# Patient Record
Sex: Male | Born: 1970 | Race: White | Hispanic: No | Marital: Married
Health system: Southern US, Community
[De-identification: ages and names within clinical notes are randomized; demographics above are authoritative.]

---

## 1998-05-28 ENCOUNTER — Ambulatory Visit (HOSPITAL_COMMUNITY): Admission: EM | Admit: 1998-05-28 | Discharge: 1998-05-28 | Payer: Self-pay | Admitting: Emergency Medicine

## 1998-07-13 ENCOUNTER — Ambulatory Visit (HOSPITAL_COMMUNITY): Admission: RE | Admit: 1998-07-13 | Discharge: 1998-07-13 | Payer: Self-pay | Admitting: Gastroenterology

## 1999-02-10 ENCOUNTER — Emergency Department (HOSPITAL_COMMUNITY): Admission: EM | Admit: 1999-02-10 | Discharge: 1999-02-10 | Payer: Self-pay | Admitting: Emergency Medicine

## 1999-12-13 ENCOUNTER — Encounter: Admission: RE | Admit: 1999-12-13 | Discharge: 1999-12-20 | Payer: Self-pay | Admitting: Anesthesiology

## 1999-12-26 ENCOUNTER — Encounter: Admission: RE | Admit: 1999-12-26 | Discharge: 2000-03-25 | Payer: Self-pay | Admitting: Anesthesiology

## 2000-06-11 ENCOUNTER — Encounter: Admission: RE | Admit: 2000-06-11 | Discharge: 2000-06-11 | Payer: Self-pay

## 2001-12-01 ENCOUNTER — Encounter: Payer: Self-pay | Admitting: Emergency Medicine

## 2001-12-01 ENCOUNTER — Emergency Department (HOSPITAL_COMMUNITY): Admission: EM | Admit: 2001-12-01 | Discharge: 2001-12-01 | Payer: Self-pay | Admitting: Emergency Medicine

## 2002-04-20 ENCOUNTER — Encounter: Payer: Self-pay | Admitting: Family Medicine

## 2002-04-20 ENCOUNTER — Encounter: Admission: RE | Admit: 2002-04-20 | Discharge: 2002-04-20 | Payer: Self-pay | Admitting: Family Medicine

## 2002-04-23 ENCOUNTER — Emergency Department (HOSPITAL_COMMUNITY): Admission: EM | Admit: 2002-04-23 | Discharge: 2002-04-23 | Payer: Self-pay

## 2002-04-27 ENCOUNTER — Encounter: Payer: Self-pay | Admitting: Neurosurgery

## 2002-04-28 ENCOUNTER — Encounter: Payer: Self-pay | Admitting: Neurosurgery

## 2002-04-29 ENCOUNTER — Inpatient Hospital Stay (HOSPITAL_COMMUNITY): Admission: RE | Admit: 2002-04-29 | Discharge: 2002-04-30 | Payer: Self-pay | Admitting: Neurosurgery

## 2005-06-08 ENCOUNTER — Observation Stay (HOSPITAL_COMMUNITY): Admission: EM | Admit: 2005-06-08 | Discharge: 2005-06-09 | Payer: Self-pay | Admitting: Emergency Medicine

## 2008-04-17 IMAGING — CT CT HEAD W/O CM
2 series · 16 of 30 positions shown, 20 images · non-contrast
Comparison: NONE

CLINICAL DATA: Headaches. 

CT HEAD WITHOUT INTRAVENOUS CONTRAST
TECHNIQUE: Axial 5-millimeter thick slices were obtained through 
the posterior fossa and 5-millimeter thick slices were obtained 
through the remaining portion of the head without intravenous 
contrast.

[Series 2: without contrast · axial · non-contrast · 0.45mm/px · z∈[+84,+234]mm · 13 of 36 slices shown, 17 images]
[im 3/36  brain]
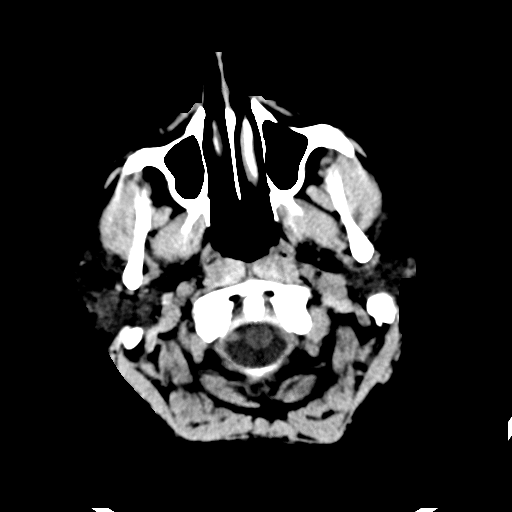
[im 3/36  bone]
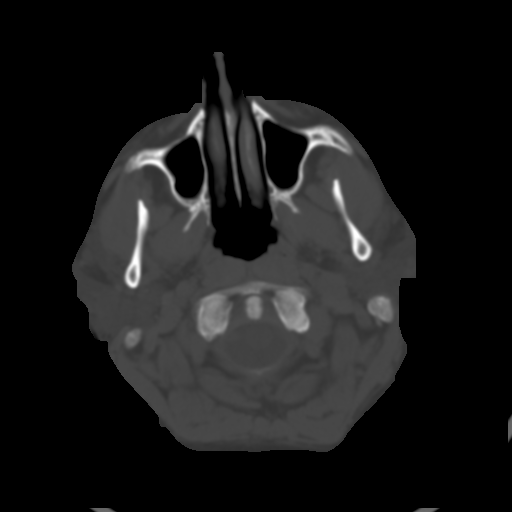
[im 6/36  brain]
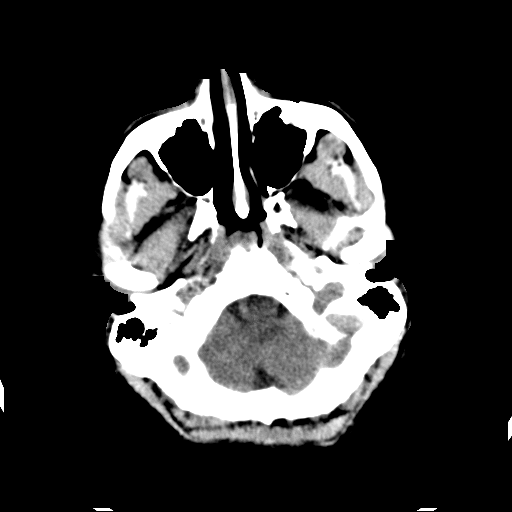
[im 8/36  brain]
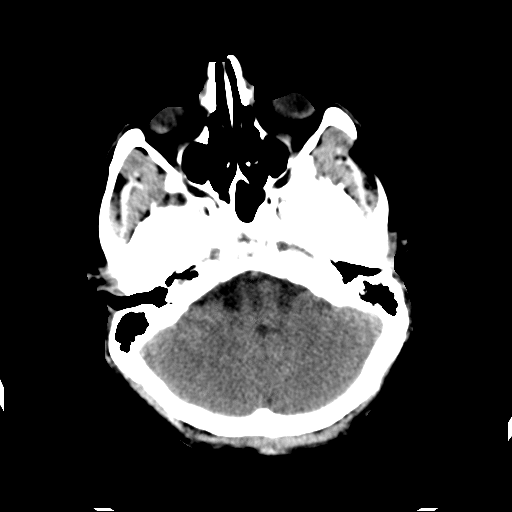
[im 11/36  brain]
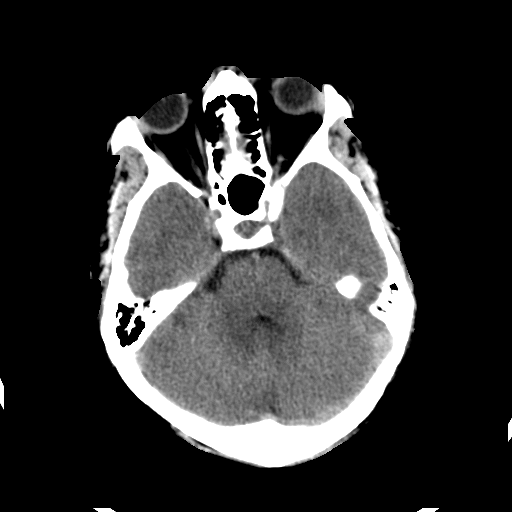
[im 13/36  brain]
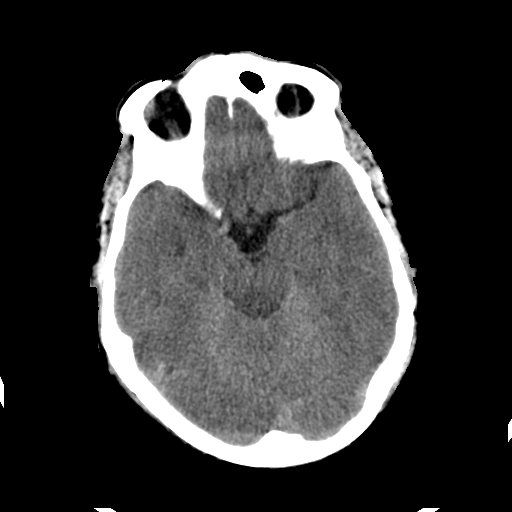
[im 13/36  bone]
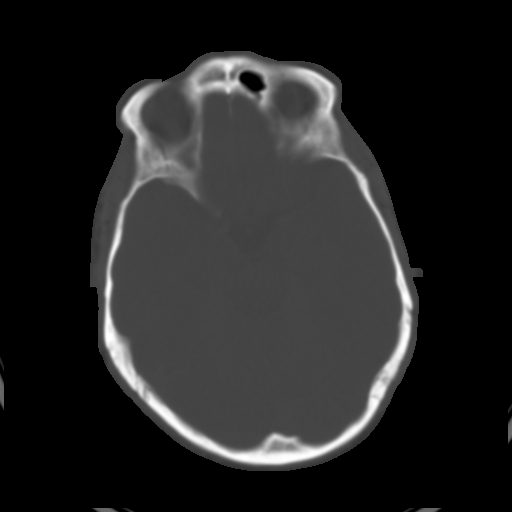
[im 16/36  brain]
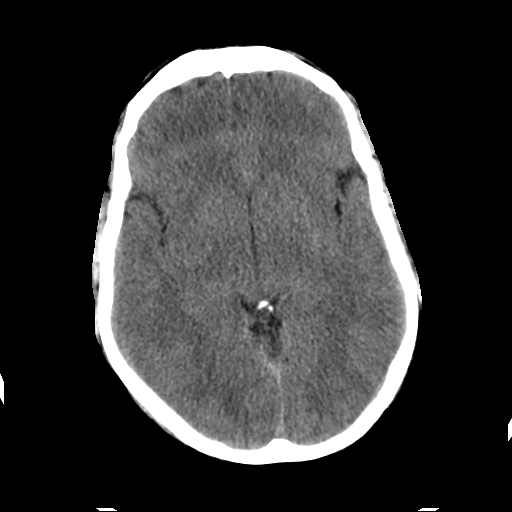
[im 18/36  brain]
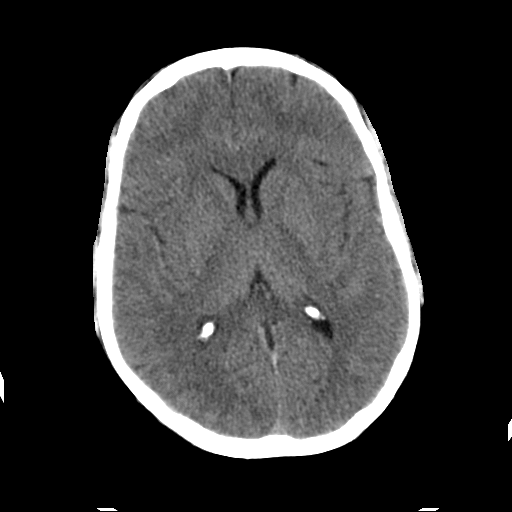
[im 21/36  brain]
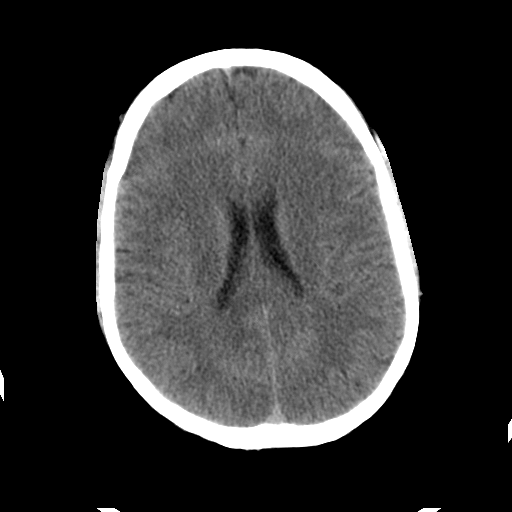
[im 23/36  brain]
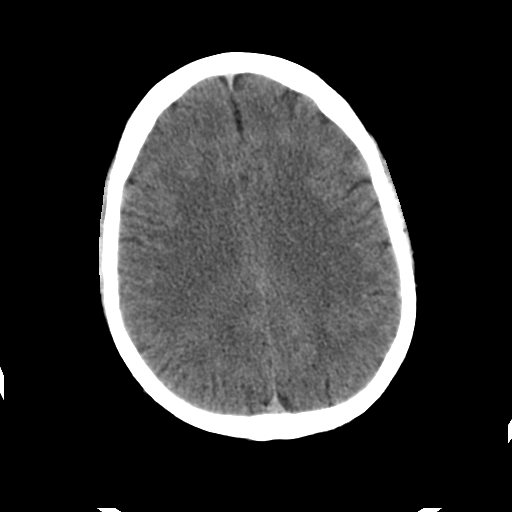
[im 23/36  bone]
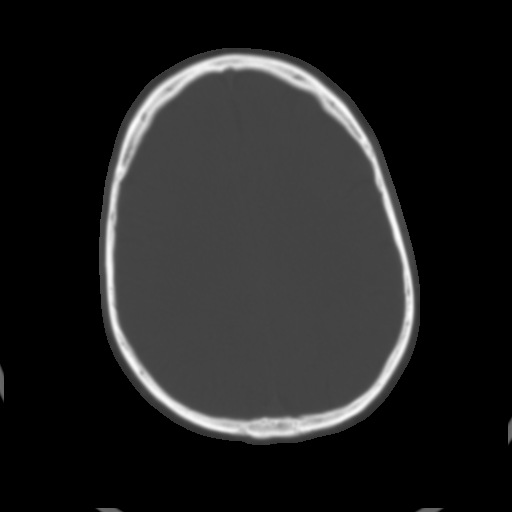
[im 26/36  brain]
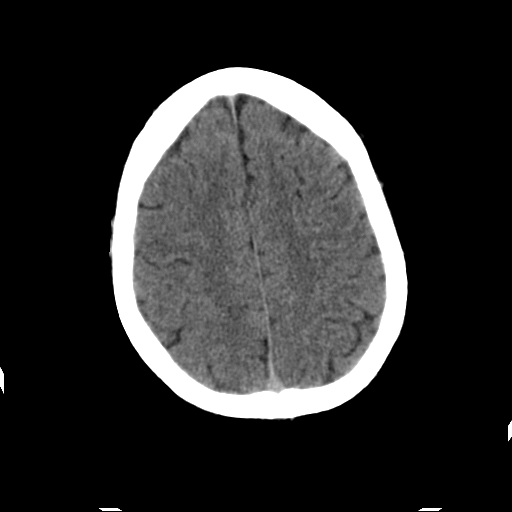
[im 28/36  brain]
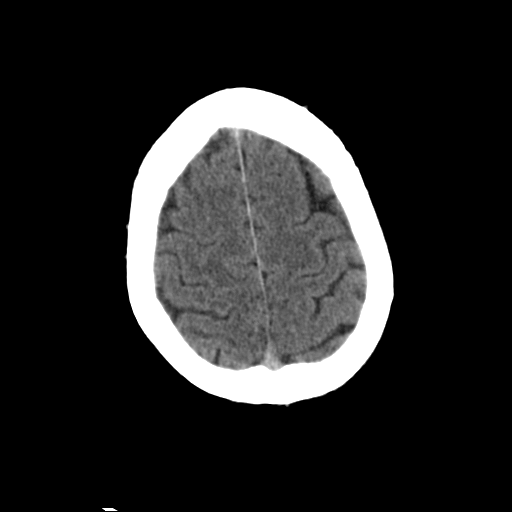
[im 31/36  brain]
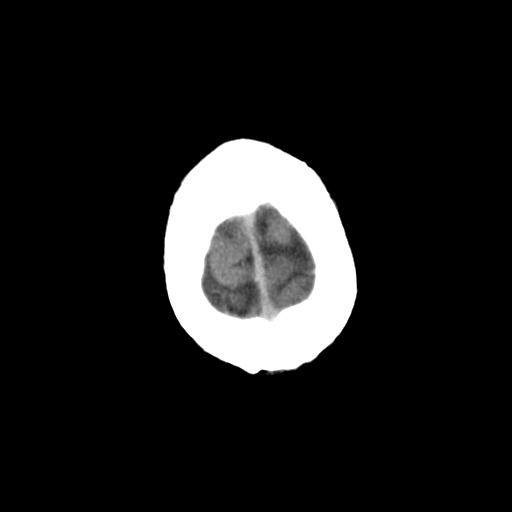
[im 33/36  brain]
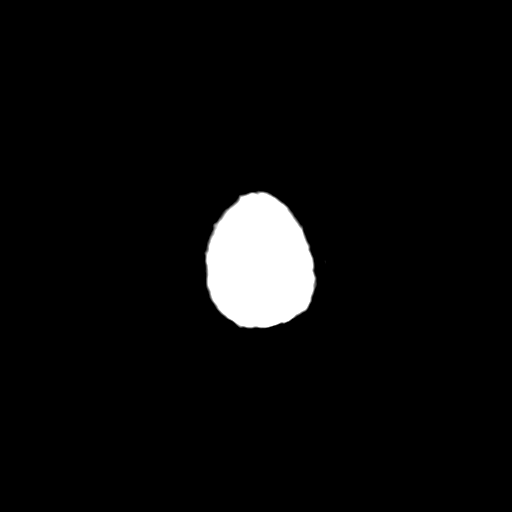
[im 33/36  bone]
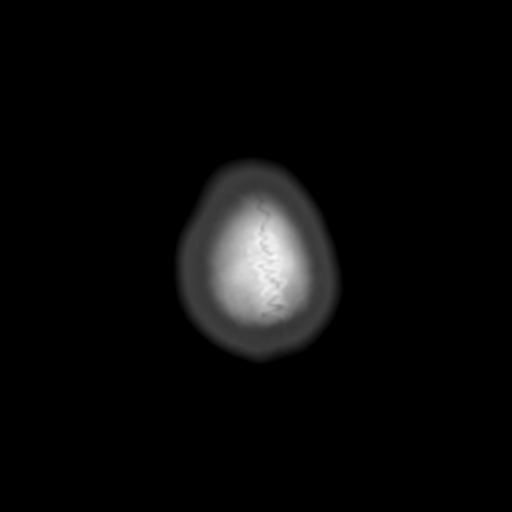

[Series 3: bone windows · axial · 0.45mm/px · z∈[+84,+134]mm · 3 of 36 slices shown]
[im 3/36  bone]
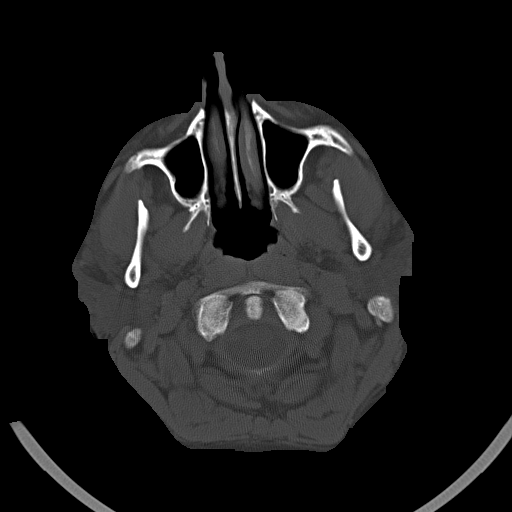
[im 8/36  bone]
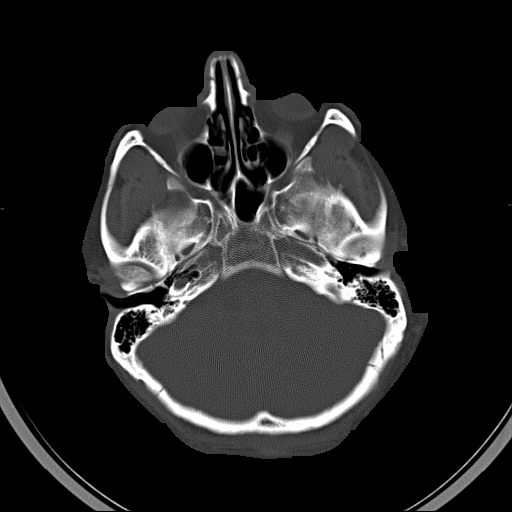
[im 13/36  bone]
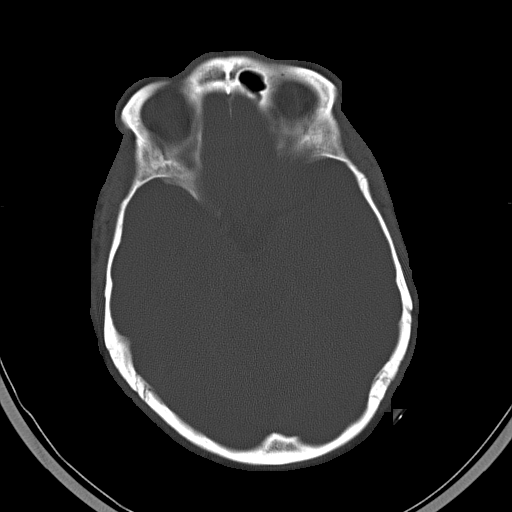

[16 of 30 positions shown; findings below may reference images not displayed]

FINDINGS: There are no air-fluid levels or opacification of the 
paranasal sinuses.  No fluid noted in the mastoid processes. The 
fourth, third and both lateral ventricles are identified.  There 
is no evidence of midline shift or mass effect infratentorially or 
supratentorially. No areas of abnormal radiolucency or 
radiodensity are identified. There is no evidence of subarachnoid 
or intracerebral hemorrhage.  There is no evidence of subdural or 
epidural hematoma. The calvarium is intact.
IMPRESSION: Normal unenhanced CT of the head. Gentilal, Gertrudes Date: 02/22/2007 DAS  JLM

## 2018-07-26 ENCOUNTER — Encounter: Payer: Self-pay | Admitting: Sports Medicine

## 2018-07-26 ENCOUNTER — Ambulatory Visit (INDEPENDENT_AMBULATORY_CARE_PROVIDER_SITE_OTHER): Payer: Self-pay | Admitting: Sports Medicine

## 2018-07-26 ENCOUNTER — Ambulatory Visit
Admission: RE | Admit: 2018-07-26 | Discharge: 2018-07-26 | Disposition: A | Discharge status: Home / Self Care | Payer: No Typology Code available for payment source | Source: Ambulatory Visit | Attending: Sports Medicine | Admitting: Sports Medicine

## 2018-07-26 VITALS — BP 120/90 | Ht 72.0 in | Wt 230.0 lb

## 2018-07-26 DIAGNOSIS — M25512 Pain in left shoulder: Principal | ICD-10-CM

## 2018-07-26 DIAGNOSIS — G8929 Other chronic pain: Secondary | ICD-10-CM

## 2018-07-26 NOTE — Progress Notes (Addendum)
   Subjective:    Patient ID: Rick Frey, male    DOB: Sep 24, 1971, 47 y.o.   MRN: [id number]  HPIchief complaint: Left shoulder pain  Very pleasant right-hand-dominant 47 year old male comes in today complaining of a little over a year of anterior and superior left shoulder pain. Pain began acutely when pulling some straw from a truck. He felt immediate pain which did improve over time. He has learned to modify his activity in regards to work. Any sort of pushing or pulling at shoulder level or higher will reproduce his pain. He had significant nighttime pain initially but he denies nighttime pain currently. He denies numbness or tingling. His primary care physician put him on oral prednisone which helped tremendously but eventually wore off. He also tried Aleve and Advil but began to experience significant stomach upset with these. He is currently using over-the-counter topical medications. He denies significant injury to this shoulder prior to the injury a little over a year ago.No prior shoulder surgeries. Past medical history is reviewed Medications reviewed Allergies reviewed He is a self-employed landscaper    Review of Systems As above    Objective:   Physical Exam  Well-developed, well-nourished. No acute distress. Awake alert and oriented 3. Vital signs reviewed  Left shoulder: Full range of motion. No gross deformity. Tenderness to palpation directly over the acromioclavicular joint. Pain with cross arm abduction test. No tenderness over the bicipital groove. Rotator cuff strength is 5/5 and does not reproduce pain. Negative empty can, negative Hawkins. Equivocal O'Brien's. Neurovascularly intact distally.      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 We may also plan on doing an ultrasound guided injection of his acromioclavicular joint at that time.  I will order an x-ray of his shoulder to be done prior to that visit. We will discuss those results at follow-up. I think he is okay to continue with activity as tolerated.  Addendum:X-ray reviewed. There are mild degenerative changes at the acromioclavicular joint. Otherwise unremarkable.

## 2018-08-04 ENCOUNTER — Encounter: Payer: Self-pay | Admitting: Sports Medicine

## 2018-08-04 ENCOUNTER — Ambulatory Visit (INDEPENDENT_AMBULATORY_CARE_PROVIDER_SITE_OTHER): Payer: Self-pay | Admitting: Sports Medicine

## 2018-08-04 DIAGNOSIS — M19012 Primary osteoarthritis, left shoulder: Secondary | ICD-10-CM | POA: Insufficient documentation

## 2018-08-04 MED ORDER — METHYLPREDNISOLONE ACETATE 40 MG/ML IJ SUSP
40.0000 mg | Freq: Once | INTRAMUSCULAR | Status: AC
  Administered 2018-08-04: 40 mg via INTRA_ARTICULAR

## 2018-08-04 NOTE — Assessment & Plan Note (Addendum)
Reviewed his x-rays with the patient and his wife demonstrating mild to moderate degenerative changes in [facility].  No abnormalities at the glenohumeral joint.  Recommend a corticosteroid injection.  INJECTION: Patient was given informed consent, signed copy in the chart. Appropriate time out was taken. Area prepped and draped in usual sterile fashion. 1 cc of methylprednisolone 40 mg/ml plus  1 cc of 0.25% bupivicaine without epinephrine was injected into the L AC joint using ultrasound guidance. The patient tolerated the procedure well. There were no complications. Post procedure instructions were given.  Recommend ice and no heavy lifting for the next two days. Afterwards, activity as tolerated with attention to proper lifting technique. NSAIDs as needed for pain.

## 2018-08-04 NOTE — Patient Instructions (Signed)
F/u with Margaretha Sheffieldraper in 3 weeks.  Injection of L AC joint today under US guidance. Counseled on post injection instructions and possibility of steroid flare. Take it easy the next two days until the steroid kicks in. Call if you have any issues.  Rotator cuff was intact via ultrasound today.

## 2018-08-04 NOTE — Progress Notes (Signed)
Rick Frey - 47 y.o. male MRN [id number]  Date of birth: 02-19-1971   Chief complaint: L shoulder pain  SUBJECTIVE:    History of present illness: 47yo M who presents today with the chief complaint of left superior shoulder pain. Symptoms began 1.5 years ago when he was grabbing a bale of straw away from his body. He felt a sudden pain in his shoulder. Denies a pop or sudden onset weakness. Since then, he has modified his activities to help with pain control.  He also takes ibuprofen intermittently for the pain although this does cause gastrointestinal symptoms.  Any time that he does repetitive lifting or overdoes it, his symptoms are exacerbated.  Relative rest improves his symptoms like when they were on vacation 3 weeks ago.  Denies associated numbness or tingling of the arm.  Denies any loss of grip strength or loss of range of motion of the left arm.  No neck pain or elbow pain.  He is right-handed.   Review of systems:  As stated above   Interval past medical history, surgical history, family history, and social history obtained and are unchanged.   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  Medications: Ibuprofen Allergies: No known drug allergies  OBJECTIVE:  Physical exam: Vital signs are reviewed. BP 120/86   Ht 6' (1.829 m)   Wt 230 lb (104.3 kg)   BMI 31.19 kg/m   Gen.: Alert, oriented, appears stated age, in no apparent distress Musculoskeletal: No obvious abnormality upon inspection of the left shoulder.  He has tenderness palpation directly over the acromioclavicular joint.  He also has some bicipital groove tenderness.  He does have pain with adduction of his left arm.  He has full range of motion in shoulder flexion, extension, abduction and adduction.  Strength testing is 5 out of 5 of the rotator cuff without reproducible pain.  He has a negative Neer's test.  Negative empty can.  Negative Hawkins.  Pain with O'Brien's test.  Pain with crossarm test.  Negative  speeds and Yergason's test.  Neurovascularly intact.  ULTRASOUND: Shoulder, L Diagnostic complete ultrasound imaging obtained of patient's L shoulder.  - No obvious evidence of bony deformity or osteophyte development appreciated.  - Long head of the biceps tendon: trace edema of biceps tendon in bicipital groove.  - Subscapularis tendon: complete visualization across the width of the insertion point yielded no evidence of tendon thickening, calcification, or tears in the long axis view.  - Supraspinatus tendon: complete visualization across the width of the insertion point yielded no evidence of tendon thickening, calcification, or tears in the long axis view. No evidence of bursal inflammation appreciated.  - Infraspinatus and teres minor tendons: visualization across the width of the insertion points yielded no evidence of tendon thickening, calcification, or tears in the long axis view.  [facility] Joint: Trace effusion present with mild spurring.  IMPRESSION: findings consistent with bicipital tendinitis and AC joint arthritis.    ASSESSMENT & PLAN: Arthritis of left acromioclavicular joint Reviewed his x-rays with the patient and his wife demonstrating mild to moderate degenerative changes in his [facility] joint.  No abnormalities at the glenohumeral joint.  Recommend a corticosteroid injection.  INJECTION: Patient was given informed consent, signed copy in the chart. Appropriate time out was taken. Area prepped and draped in usual sterile fashion. 1 cc of methylprednisolone 40 mg/ml plus  1 cc of 0.25% bupivicaine without epinephrine was injected into the L AC joint using ultrasound guidance. The  patient tolerated the procedure well. There were no complications. Post procedure instructions were given.  Recommend ice and no heavy lifting for the next two days. Afterwards, activity as tolerated with attention to proper lifting technique. NSAIDs as needed for pain.    Meds ordered this encounter    Medications  . methylPREDNISolone acetate (DEPO-MEDROL) injection 40 mg      Gustavus MessingAJ Tyshan Enderle, DO Sports Medicine Fellow [facility]

## 2018-08-24 ENCOUNTER — Other Ambulatory Visit: Payer: Self-pay | Admitting: Sports Medicine

## 2019-09-22 IMAGING — DX DG SHOULDER 2+V*L*
3 series · 3 of 3 positions shown · non-contrast
Comparison: None.

CLINICAL DATA: Left shoulder injury 1 year ago.  Pain.

EXAM:
LEFT SHOULDER - 2+ VIEW

[dg shoulder left (1 of 3)]
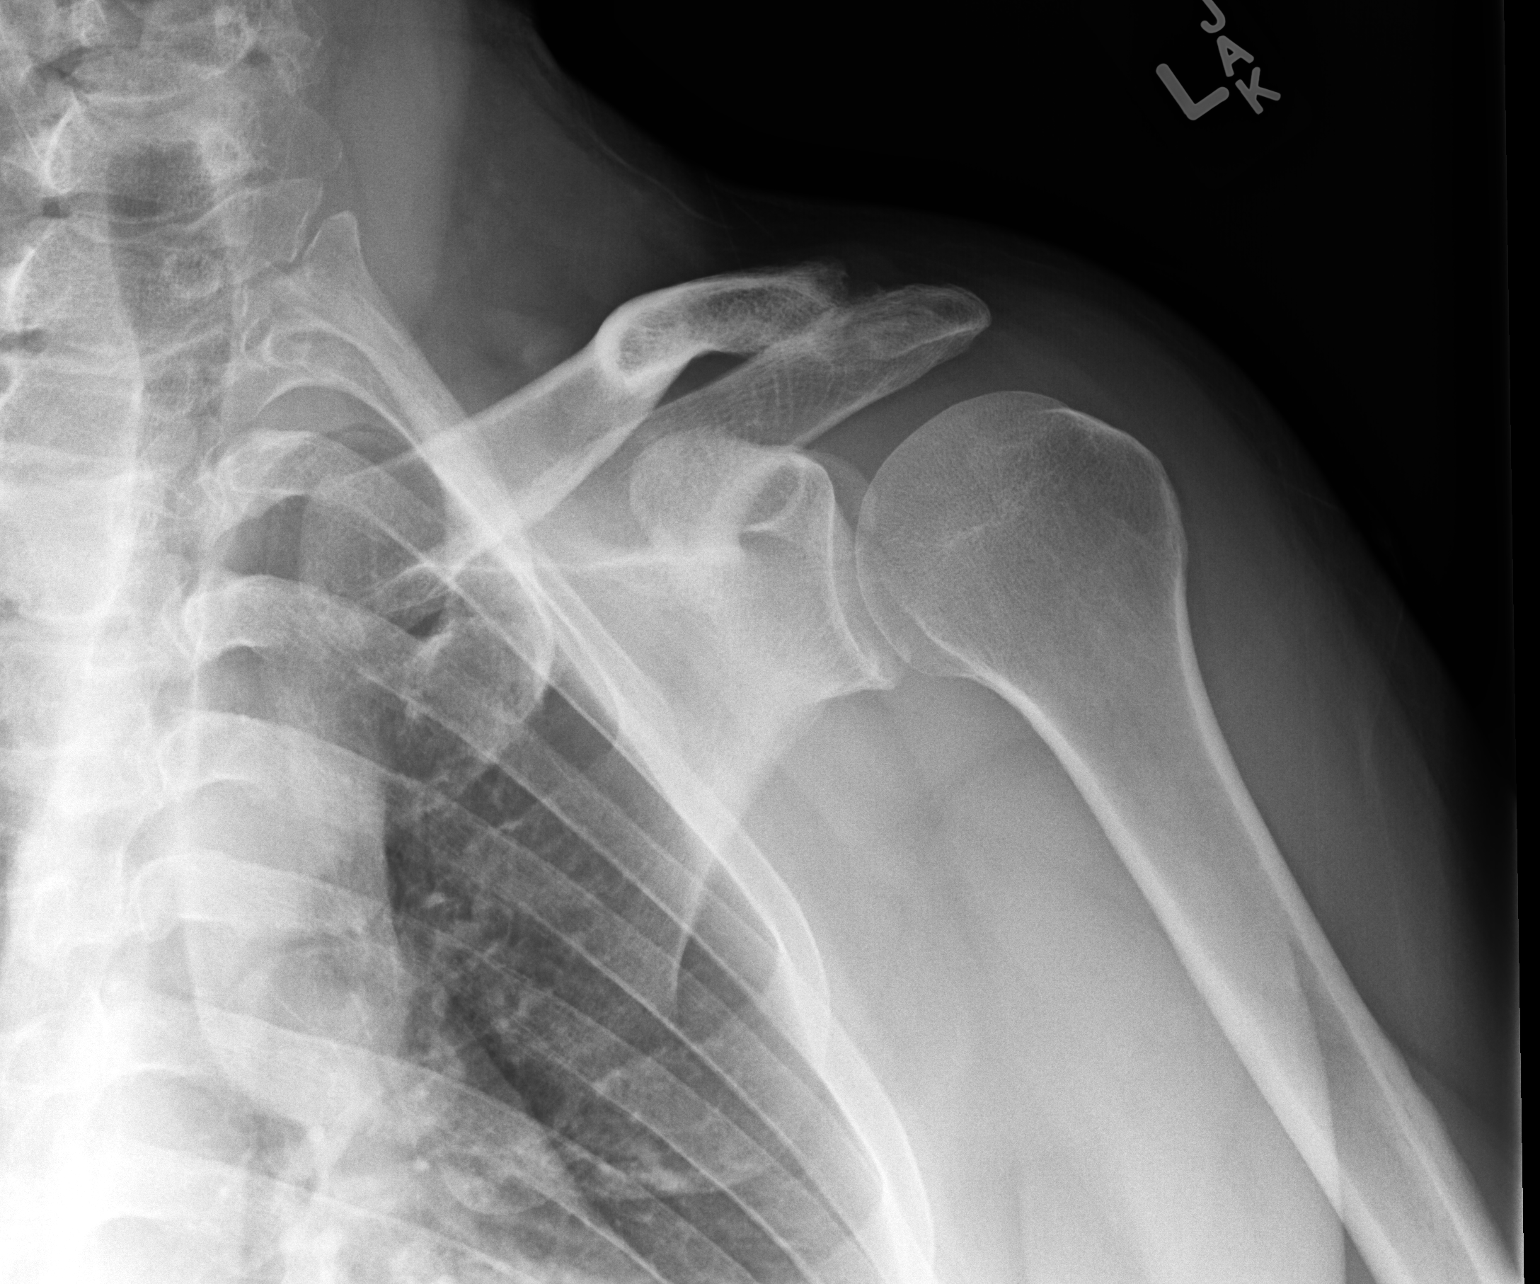

[dg shoulder left (2 of 3)]
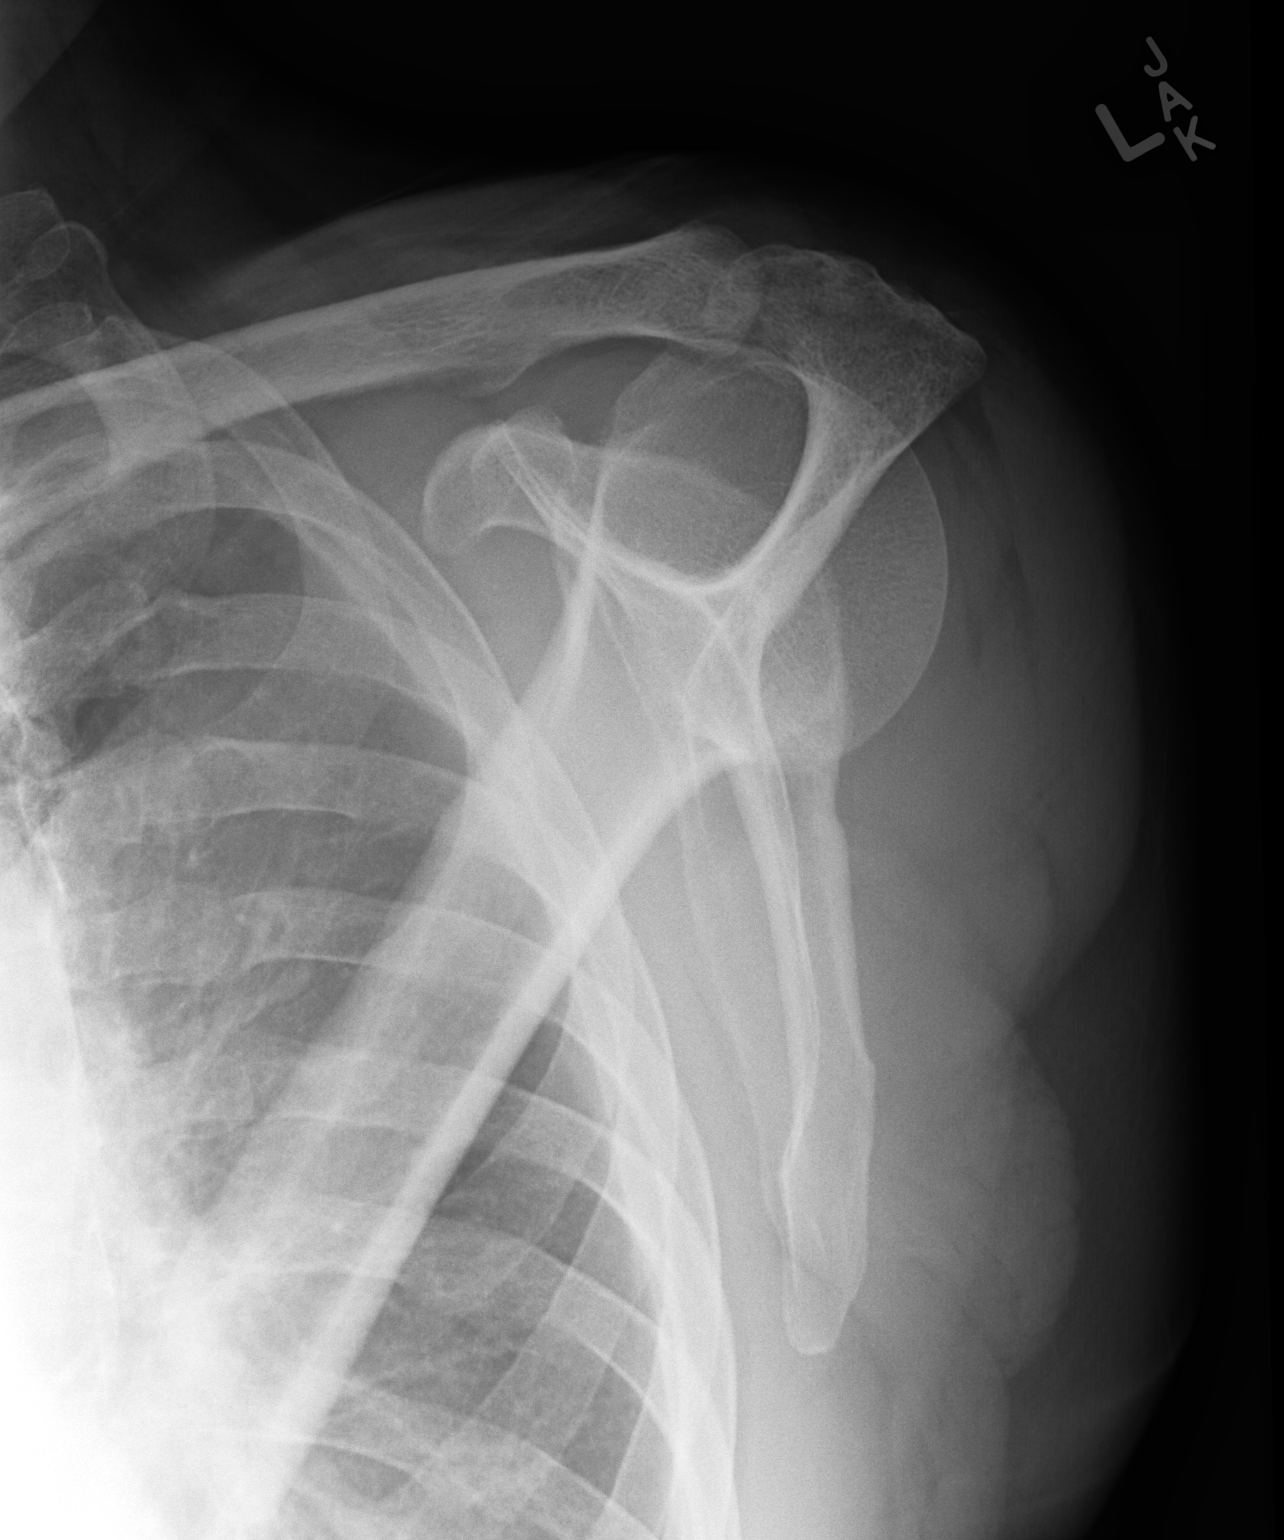

[dg shoulder left (3 of 3)]
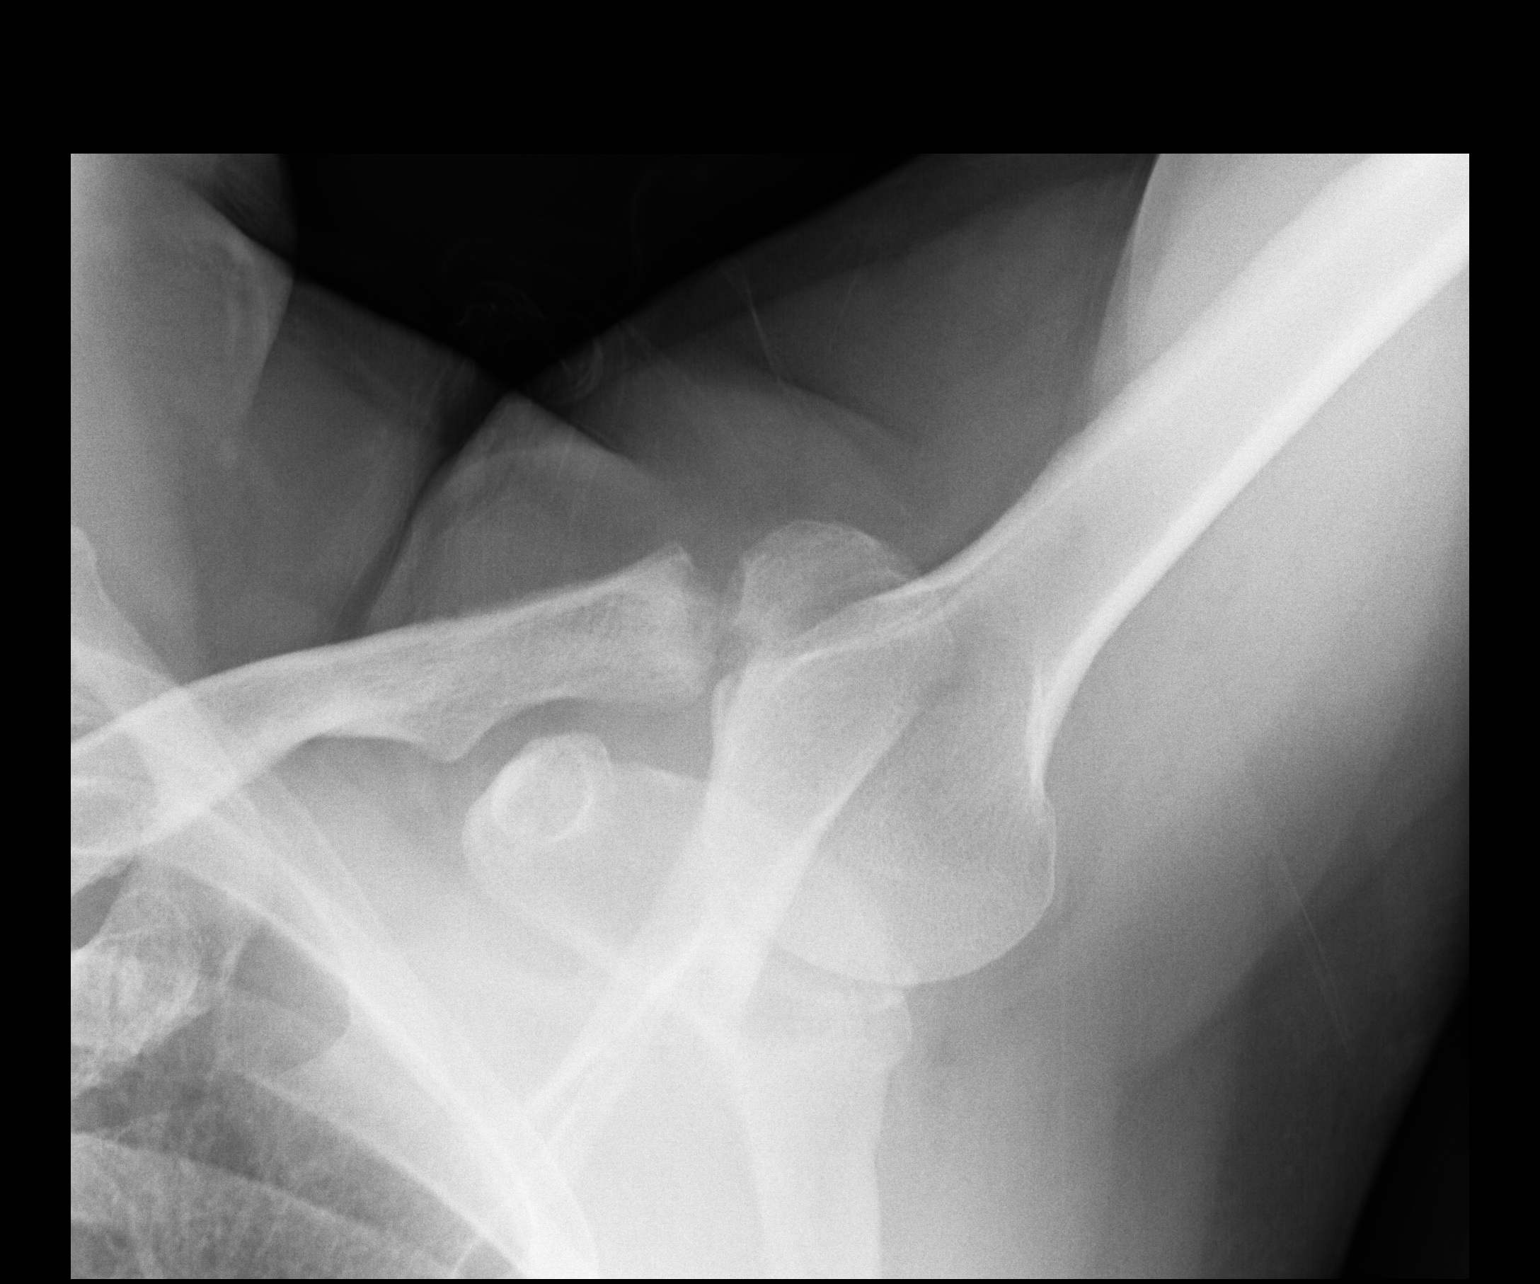

[3 of 3 positions shown; findings below may reference images not displayed]

FINDINGS: There is no fracture or dislocation. The glenohumeral joint is
normal. There are mild degenerative changes of the acromioclavicular
joint.
IMPRESSION: No acute osseous injury of the left shoulder.

## 2022-07-16 DIAGNOSIS — L821 Other seborrheic keratosis: Secondary | ICD-10-CM | POA: Diagnosis not present

## 2022-07-16 DIAGNOSIS — D1801 Hemangioma of skin and subcutaneous tissue: Secondary | ICD-10-CM | POA: Diagnosis not present

## 2022-07-16 DIAGNOSIS — L814 Other melanin hyperpigmentation: Secondary | ICD-10-CM | POA: Diagnosis not present

## 2022-07-22 ENCOUNTER — Other Ambulatory Visit: Payer: Self-pay | Admitting: Family Medicine

## 2022-07-22 ENCOUNTER — Other Ambulatory Visit (HOSPITAL_COMMUNITY): Payer: Self-pay | Admitting: Family Medicine

## 2022-07-22 DIAGNOSIS — I10 Essential (primary) hypertension: Secondary | ICD-10-CM | POA: Diagnosis not present

## 2022-07-22 DIAGNOSIS — Z87891 Personal history of nicotine dependence: Secondary | ICD-10-CM

## 2022-07-22 DIAGNOSIS — E785 Hyperlipidemia, unspecified: Secondary | ICD-10-CM | POA: Diagnosis not present

## 2022-07-22 DIAGNOSIS — Z125 Encounter for screening for malignant neoplasm of prostate: Secondary | ICD-10-CM | POA: Diagnosis not present

## 2022-08-04 DIAGNOSIS — E875 Hyperkalemia: Secondary | ICD-10-CM | POA: Diagnosis not present

## 2022-11-06 DIAGNOSIS — Z800 Family history of malignant neoplasm of digestive organs: Secondary | ICD-10-CM | POA: Diagnosis not present

## 2022-11-06 DIAGNOSIS — K219 Gastro-esophageal reflux disease without esophagitis: Secondary | ICD-10-CM | POA: Diagnosis not present

## 2022-11-06 DIAGNOSIS — K625 Hemorrhage of anus and rectum: Secondary | ICD-10-CM | POA: Diagnosis not present

## 2022-11-06 DIAGNOSIS — Z1211 Encounter for screening for malignant neoplasm of colon: Secondary | ICD-10-CM | POA: Diagnosis not present

## 2022-11-06 DIAGNOSIS — I10 Essential (primary) hypertension: Secondary | ICD-10-CM | POA: Diagnosis not present

## 2024-01-03 DIAGNOSIS — R0982 Postnasal drip: Secondary | ICD-10-CM | POA: Diagnosis not present

## 2024-01-03 DIAGNOSIS — J029 Acute pharyngitis, unspecified: Secondary | ICD-10-CM | POA: Diagnosis not present

## 2024-01-03 DIAGNOSIS — R5383 Other fatigue: Secondary | ICD-10-CM | POA: Diagnosis not present

## 2024-01-03 DIAGNOSIS — R051 Acute cough: Secondary | ICD-10-CM | POA: Diagnosis not present

## 2024-01-05 DIAGNOSIS — R058 Other specified cough: Secondary | ICD-10-CM | POA: Diagnosis not present

## 2024-01-13 DIAGNOSIS — J111 Influenza due to unidentified influenza virus with other respiratory manifestations: Secondary | ICD-10-CM | POA: Diagnosis not present

## 2024-01-13 DIAGNOSIS — R0602 Shortness of breath: Secondary | ICD-10-CM | POA: Diagnosis not present

## 2024-01-14 DIAGNOSIS — R059 Cough, unspecified: Secondary | ICD-10-CM | POA: Diagnosis not present

## 2024-03-10 DIAGNOSIS — I10 Essential (primary) hypertension: Secondary | ICD-10-CM | POA: Diagnosis not present

## 2024-03-10 DIAGNOSIS — M7710 Lateral epicondylitis, unspecified elbow: Secondary | ICD-10-CM | POA: Diagnosis not present
